# Patient Record
Sex: Female | Born: 1965 | Hispanic: No | Marital: Single
Health system: Southern US, Community
[De-identification: ages and names within clinical notes are randomized; demographics above are authoritative.]

---

## 2005-01-04 ENCOUNTER — Other Ambulatory Visit: Admission: RE | Admit: 2005-01-04 | Discharge: 2005-01-04 | Payer: Self-pay | Admitting: Family Medicine

## 2005-06-24 ENCOUNTER — Encounter (INDEPENDENT_AMBULATORY_CARE_PROVIDER_SITE_OTHER): Payer: Self-pay | Admitting: *Deleted

## 2005-06-24 ENCOUNTER — Inpatient Hospital Stay (HOSPITAL_COMMUNITY): Admission: RE | Admit: 2005-06-24 | Discharge: 2005-06-26 | Payer: Self-pay | Admitting: Obstetrics and Gynecology

## 2009-03-17 ENCOUNTER — Other Ambulatory Visit: Admission: RE | Admit: 2009-03-17 | Discharge: 2009-03-17 | Payer: Self-pay | Admitting: Family Medicine

## 2010-09-21 NOTE — Discharge Summary (Signed)
Summer Gentry, Summer Gentry              ACCOUNT NO.:  [id number]   MEDICAL RECORD NO.:  [id number]          PATIENT TYPE:  INP   LOCATION:  9302                          FACILITY:  [facility]   PHYSICIAN:  Gerald Leitz, MD          DATE OF BIRTH:  [birth year]   DATE OF ADMISSION:  06/24/2005  DATE OF DISCHARGE:  06/26/2005                                 DISCHARGE SUMMARY   HOSPITAL COURSE:  The patient was admitted on June 24, 2005. Underwent  an abdominal hysterectomy. She did well postoperatively. Postoperative  hematocrit was 7.4. This was treated with iron. She was discharged home on  June 26, 2005 on the following medications.   DISCHARGE MEDICATIONS:  Motrin, Percocet, iron sulfate. She was instructed  to take Colace over-the-counter.   DIET:  Regular.   ACTIVITY:  Ad lib. Pelvic rest for 4 weeks.   FOLLOW UP:  With Dr. Richardson Dopp in 2 weeks for postoperative visit.   CONDITION ON DISCHARGE:  No complications during admission.      Gerald Leitz, MD  Electronically Signed     TC/MEDQ  D:  08/10/2005  T:  08/10/2005  Job:  [id number]

## 2010-09-21 NOTE — H&P (Signed)
NAME:  Summer Gentry, Summer Gentry              ACCOUNT NO.:  [id number]   MEDICAL RECORD NO.:  [id number]          PATIENT TYPE:  INP   LOCATION:  NA                            [facility]  [facility]   PHYSICIAN:  Gerald Leitz, MD          DATE OF BIRTH:  1965/05/30   DATE OF ADMISSION:  DATE OF DISCHARGE:                                HISTORY & PHYSICAL   HISTORY OF PRESENT ILLNESS:  This is a 45 year old G3, P2-0-1-2 with  symptomatic uterine fibroids and menorrhagia.  She has an 18-week sized  fibroid uterus.  She has a history of having had intramenstrual bleeding for  the past three years.  Endometrial biopsy was performed in the office but  results are pending at the time of this dictation.  Patient reports severe  dysmenorrhea and extremely heavy menses.   PAST OBSTETRICAL HISTORY:  Cesarean section x2.   PAST MEDICAL HISTORY:  Negative.   PAST SURGICAL HISTORY:  Cesarean section x2.   MEDICATIONS:  Doxycycline for alopecia.   ALLERGIES:  ERYTHROMYCIN which causes nausea.   SOCIAL HISTORY:  Patient is married.  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  She denies tobacco, alcohol,  or illicit drug use.   FAMILY HISTORY:  Negative for breast, ovarian, or colon cancer.   REVIEW OF SYSTEMS:  Urinary frequency positive.  Other, review of systems is  negative.   PHYSICAL EXAMINATION:  VITAL SIGNS:  Blood pressure 136/92, heart rate 72,  weight 148.5 pounds, height 5 feet 3 inches.  CARDIOVASCULAR:  Regular rate and rhythm.  LUNGS:  Clear to auscultation bilaterally.  ABDOMEN:  Soft, nontender, nondistended.  Pelvic mass is noted on abdominal  examination that reaches at least 2 cm below the umbilicus.  PELVIC:  Normal external female genitalia.  No vulvar, vaginal, or cervical  lesions are noted.  On bimanual examination the uterus is approximately 18  weeks in size.  It is tender to palpation.  The adnexa are difficult to  evaluate secondary to pelvic mass.   The uterus is freely mobile and rectal  examination confirms pelvic examination.  EXTREMITIES:  No clubbing, cyanosis, edema.   LABORATORIES:  Pap smear performed September 2006 was normal.  Endometrial  biopsy results pending.   ASSESSMENT/PLAN:  Symptomatic uterine fibroids, menorrhagia, dysmenorrhea.  Patient desires definitive surgical management via abdominal hysterectomy.  We will leave the ovaries as long as they appear normal.  Risks, benefits,  and alternatives of surgery were discussed with the patient including  infection, bleeding, damage to  surrounding organs such as the bowel, bladder with need for further surgery.  Transfusion was discussed as well, HIV, hepatitis B, C, and syphilis.  Patient understands risks, wishes to proceed.  She is scheduled for surgery  on June 24, 2005.      Gerald Leitz, MD  Electronically Signed     TC/MEDQ  D:  06/19/2005  T:  06/19/2005  Job:  [id number]   cc:   [facility]   Admission testing

## 2010-09-21 NOTE — Op Note (Signed)
Summer Gentry, Summer Gentry              ACCOUNT NO.:  [id number]   MEDICAL RECORD NO.:  [id number]          PATIENT TYPE:  INP   LOCATION:  9302                          FACILITY:  [facility]   PHYSICIAN:  Gerald Leitz, MD          DATE OF BIRTH:  1966-02-24   DATE OF PROCEDURE:  06/24/2005  DATE OF DISCHARGE:                                 OPERATIVE REPORT   PREOPERATIVE DIAGNOSES:  1.  Symptomatic uterine fibroids.  2.  Menorrhagia.   POSTOPERATIVE DIAGNOSES:  1.  Symptomatic uterine fibroids.  2.  Menorrhagia.  3.  Abdominal and pelvic adhesions.   OPERATION/PROCEDURE:  Total abdominal hysterectomy with extensive lysis of  adhesions.   SURGEON:  1.  Gerald Leitz, M.D.  2.  Charles A. Sydnee Cabal, M.D.   ANESTHESIA:  General.   COMPLICATIONS:  None.   ESTIMATED BLOOD LOSS:  300 mL.   FLUIDS:  Per anesthesia.   URINARY OUTPUT:  Per anesthesia.   FINDINGS:  Enlarged fibroid uterus with multiple fibroids, approximately 18  weeks size.  Adhesions of the omentum to the anterior abdominal wall as well  as adhesions of the bladder to the uterus. Both ovaries appeared normal.   SPECIMENS:  Uterus.  All specimens sent to pathology.   COMPLICATIONS:  None.   DESCRIPTION OF PROCEDURE:  The risks, benefits, indications, and  alternatives to the procedure were reviewed with the patient.  Informed  consent was obtained.  The patient was taken to the operating room where she  was placed under general anesthesia.  She was then prepped and draped in the  usual sterile fashion.  A midline incision was made with the scalped, 2 cm  from the umbilicus down to the pubic symphysis.  The incision was carried  down to the underlying fascia.  The fascia was incised with the scalpel.  The incision was extended superiorly and inferiorly.  The peritoneum was  identified and entered sharply with Metzenbaum scissors.  The incision was  extended superiorly and inferiorly.  The patient was noted to have  adhesions  of omentum to the anterior abdominal wall.  These adhesions were removed  with the use of Kelly clamps, transected and then ligated with free-ties of  0 Vicryl.  Uterus was identified and noted to have adhesions to the anterior  abdominal wall.  This was taken down with blunt dissection and the bladder  was adherent high upon the uterus with extensive scar tissue.  This was  removed with a series of sharp dissection with Metzenbaum scissors.  Dissection took approximately 1-1/2 hours to restore normal anatomy.  Balfour retractor was then placed into the incision and the bowel was packed  away with moist laparotomy sponges.  Round ligaments were identified on both  sides, clamped and transected and suture ligated with 0 Vicryl.  The  anterior leaf of the broad ligament was incised along bladder reflection to  the midline on both sides.  Again extensive bladder adhesions were  encountered.  The bladder was gently dissected off the lower uterine segment  with Metzenbaum scissors.  Infundibulopelvic  ligaments were identified as  well as the ureters bilaterally.  The utero-ovarian ligaments were doubly  clamped, transected and suture ligated with 0 Vicryl. Excellent hemostasis  was noted.  The uterine arteries were then skeletonized bilaterally, clamped  with Heaney clamps, transected and suture ligated with 0 Vicryl.  Uterosacral ligaments on both sides were difficult to visualize due to the  size of the fibroid uterus once the uterine arteries were transected and  suture ligated.  The superior portion of the uterus was transected from the  cervix using scalpel to obtain better visualization.  The uterosacral  ligaments were then clamped on both sides, transected and suture ligated.  Cervix was amputated with Mayo scissors.  The vaginal cuff angles were  closed with 0 Vicryl and transfixed to the ipsilateral uterosacral ligament.  Next, the remainder of the vaginal cuff was closed  with running stitch of 0  Vicryl.  The pelvis was irrigated copiously with warm normal saline.  Complete hemostasis was assured.  The bladder was then filled with sterile  milk to assess for any bladder injury.  Approximately 250 mL were instilled.  No injuries were noted.  All laparotomy sponges and instruments were removed  from the abdomen.  Fascia was closed with 0 PDS in a running fashion.  The  skin was closed with  staples.  Sponge, lap, needle and instrument counts  were correct x2.  The patient was taken to the recovery room awake and in  stable condition.   Again, there were extensive abdominal and pelvic adhesions as well as  adhesions of the bladder to the uterus.  Length of time to perform lysis of  adhesions was approximately 1-1/2 hours, extending the length of the case.      Gerald Leitz, MD  Electronically Signed     TC/MEDQ  D:  06/24/2005  T:  06/25/2005  Job:  [id number]

## 2011-04-19 ENCOUNTER — Other Ambulatory Visit: Payer: Self-pay | Admitting: Family Medicine

## 2011-05-01 ENCOUNTER — Other Ambulatory Visit: Payer: Self-pay

## 2011-05-27 ENCOUNTER — Ambulatory Visit
Admission: RE | Admit: 2011-05-27 | Discharge: 2011-05-27 | Disposition: A | Discharge status: Home / Self Care | Payer: 59 | Source: Ambulatory Visit | Attending: Family Medicine | Admitting: Family Medicine

## 2011-12-24 ENCOUNTER — Other Ambulatory Visit: Payer: Self-pay | Admitting: Family Medicine

## 2011-12-24 ENCOUNTER — Other Ambulatory Visit (HOSPITAL_COMMUNITY)
Admission: RE | Admit: 2011-12-24 | Discharge: 2011-12-24 | Disposition: A | Discharge status: Home / Self Care | Payer: 59 | Source: Ambulatory Visit | Attending: Family Medicine | Admitting: Family Medicine

## 2011-12-24 DIAGNOSIS — Z113 Encounter for screening for infections with a predominantly sexual mode of transmission: Secondary | ICD-10-CM | POA: Insufficient documentation

## 2011-12-24 DIAGNOSIS — Z01419 Encounter for gynecological examination (general) (routine) without abnormal findings: Secondary | ICD-10-CM | POA: Insufficient documentation

## 2012-07-14 ENCOUNTER — Other Ambulatory Visit: Payer: Self-pay | Admitting: Obstetrics and Gynecology

## 2013-07-02 IMAGING — US US PELVIS COMPLETE
1 series · 14 of 25 positions shown · non-contrast
Comparison: None

CLINICAL DATA: Left lower abdominal and pelvic pain

TRANSABDOMINAL AND TRANSVAGINAL ULTRASOUND OF PELVIS
TECHNIQUE: Both transabdominal and transvaginal ultrasound
examinations of the pelvis were performed including evaluation of
the uterus, ovaries, adnexal regions, and pelvic cul-de-sac.

[Series 1: us pelvis complete · 0.28mm/px · 14 of 39 slices shown]
[im 1/39]
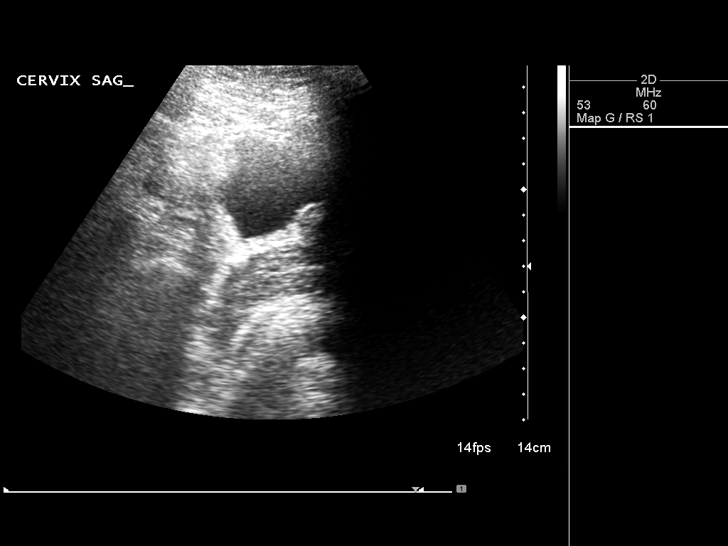
[im 4/39]
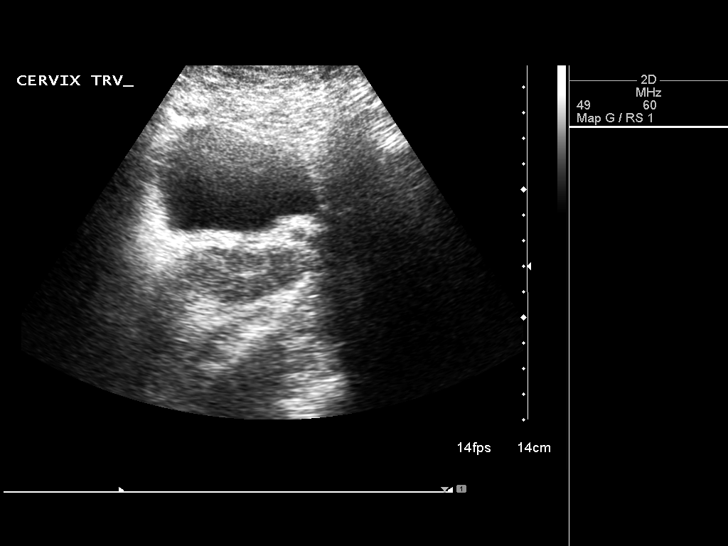
[im 7/39]
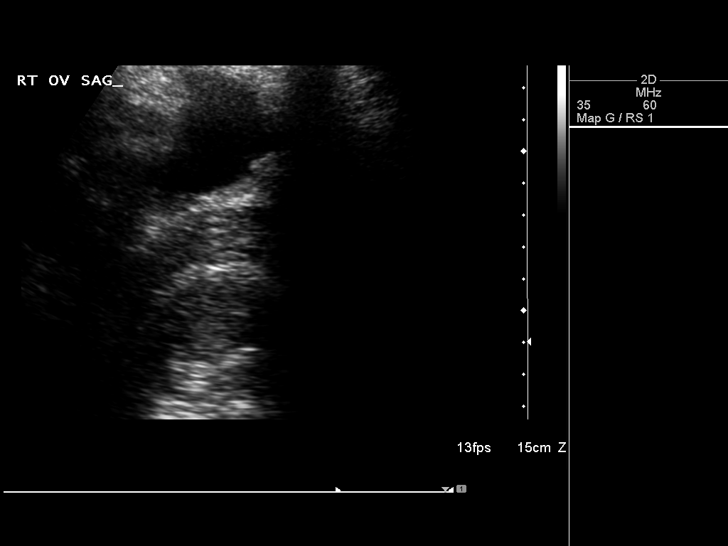
[im 10/39]
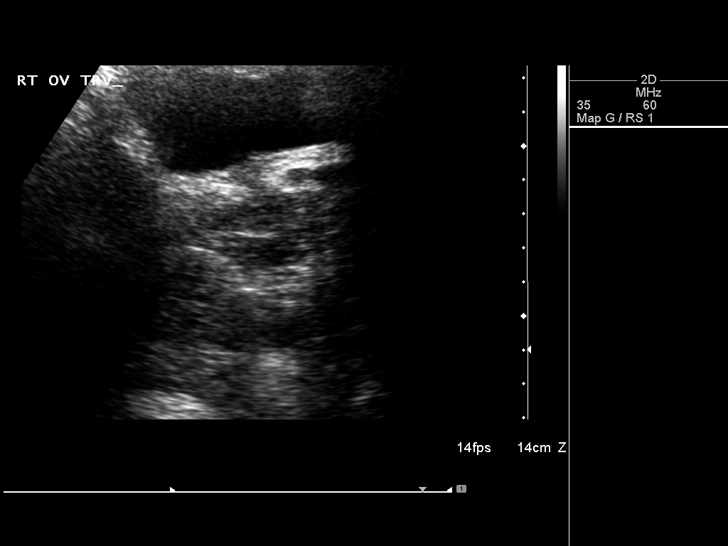
[im 13/39]
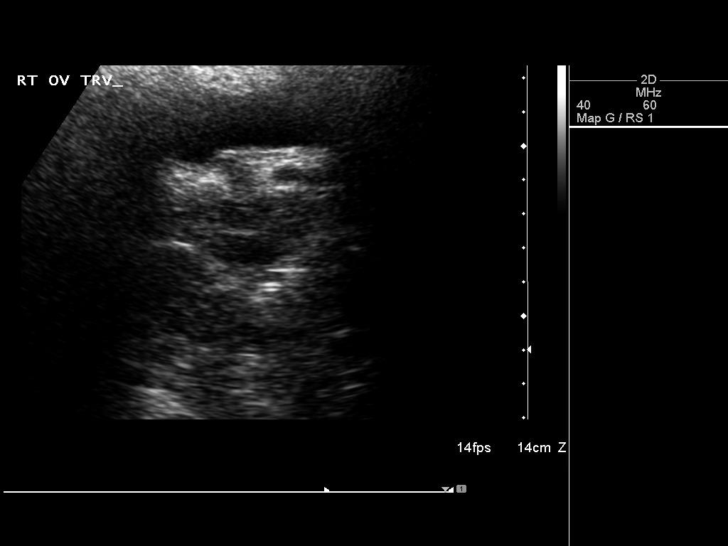
[im 15/39]
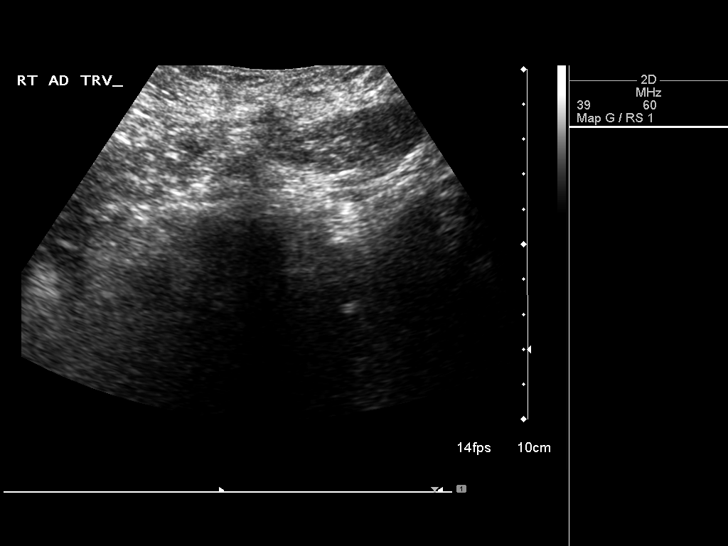
[im 18/39]
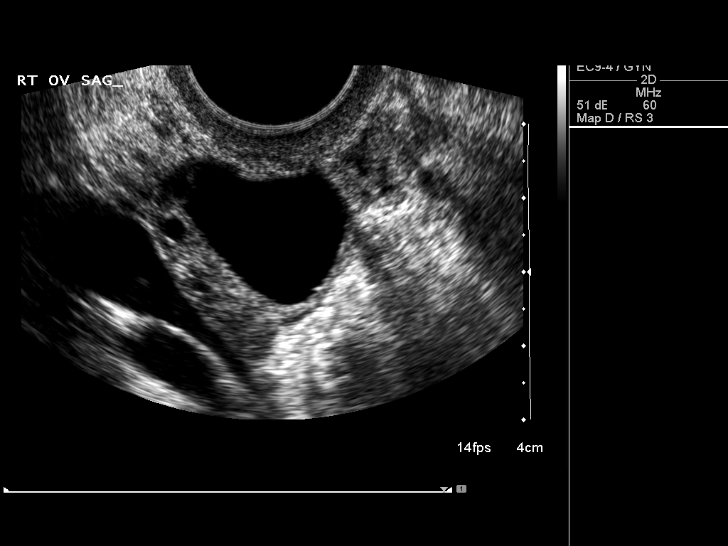
[im 21/39]
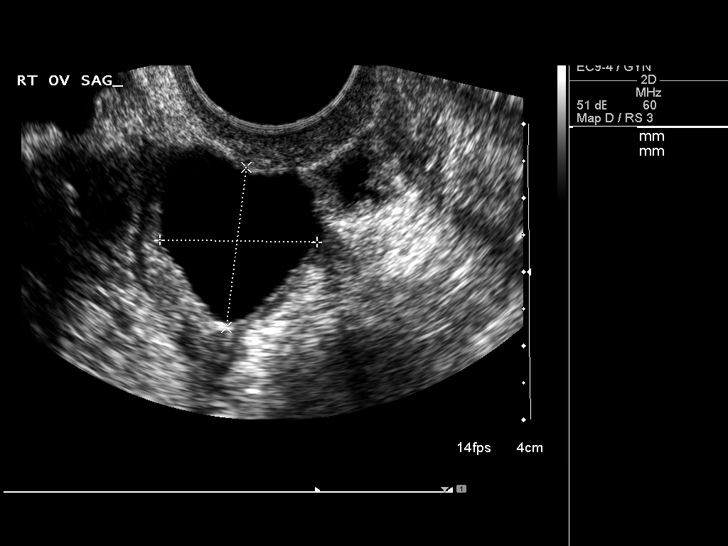
[im 24/39]
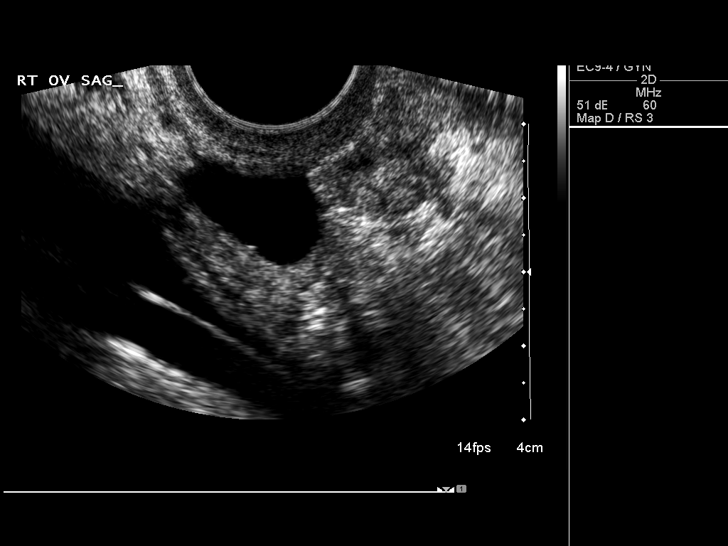
[im 26/39]
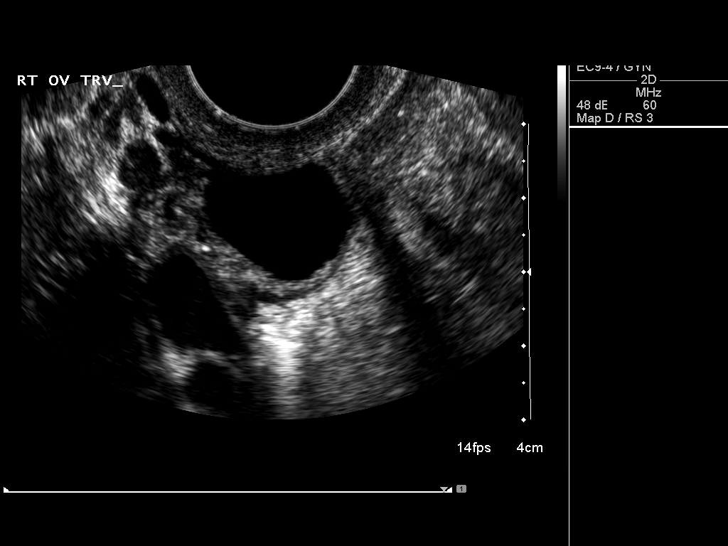
[im 29/39]
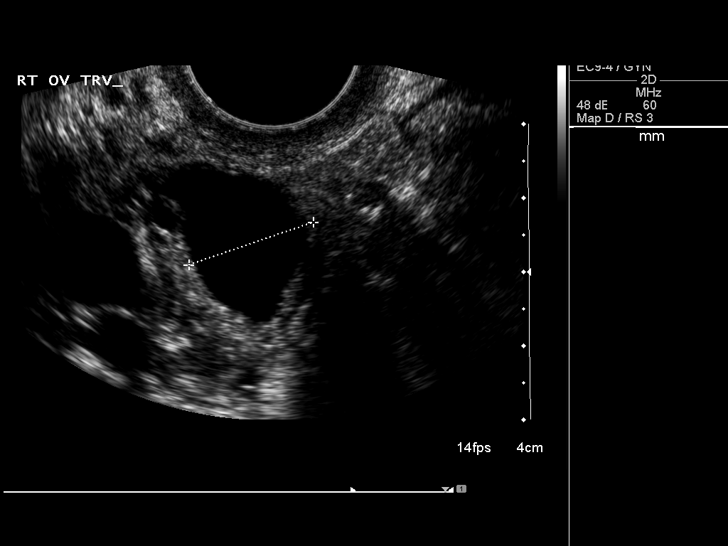
[im 32/39]
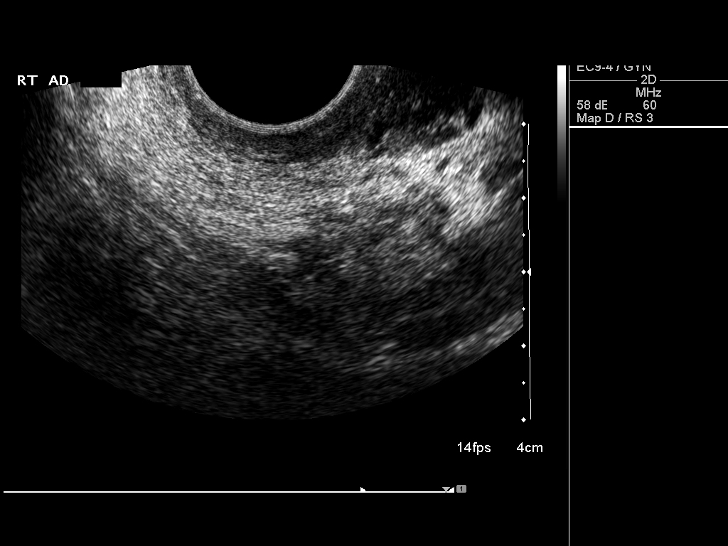
[im 35/39]
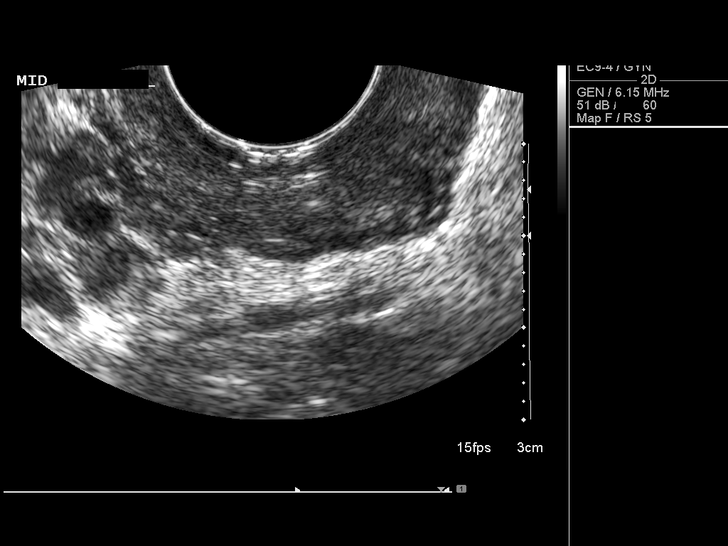
[im 39/39]
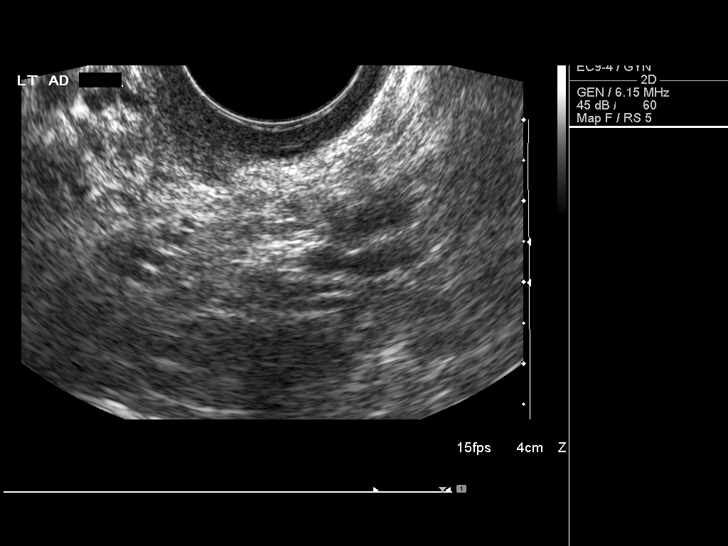

[14 of 25 positions shown; findings below may reference images not displayed]

FINDINGS: Uterus: The uterus has previously been resected.

Endometrium:Not applicable.

Right Ovary :The right ovary measures 2.8 x 2.2 but 2.5 cm.  A
right ovarian cyst is present of 2.1 x 2.2 x 1.8 cm with no
complicating features.

Left Ovary :The left ovary is not visualized, and there is a large
amount of bowel gas present.  No mass or fluid is seen within the
left adnexa.

Other Findings:
IMPRESSION: 2.2 cm right ovarian cyst.  The left ovary is not seen. Prior
hysterectomy.

## 2013-07-02 IMAGING — US US ABDOMEN COMPLETE
1 series · 14 of 25 positions shown · non-contrast
Comparison: None.

CLINICAL DATA: Abdominal pain.

COMPLETE ABDOMINAL ULTRASOUND

[Series 1: us abdomen complete · 0.17mm/px · 14 of 85 slices shown]
[im 1/85]
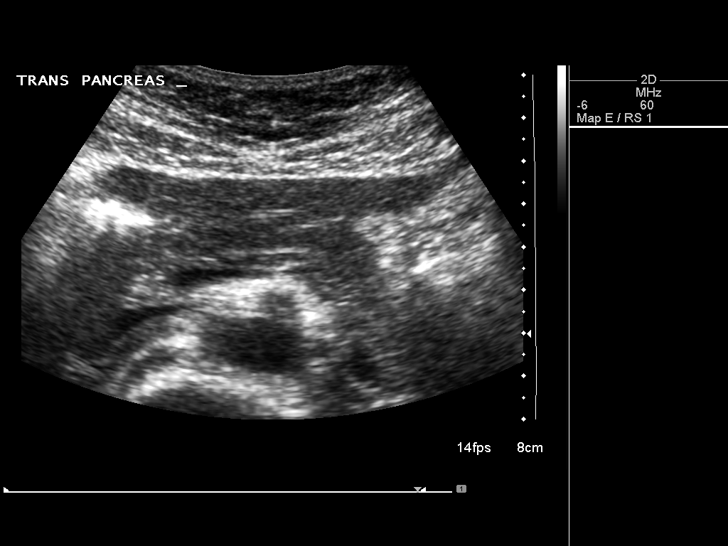
[im 8/85]
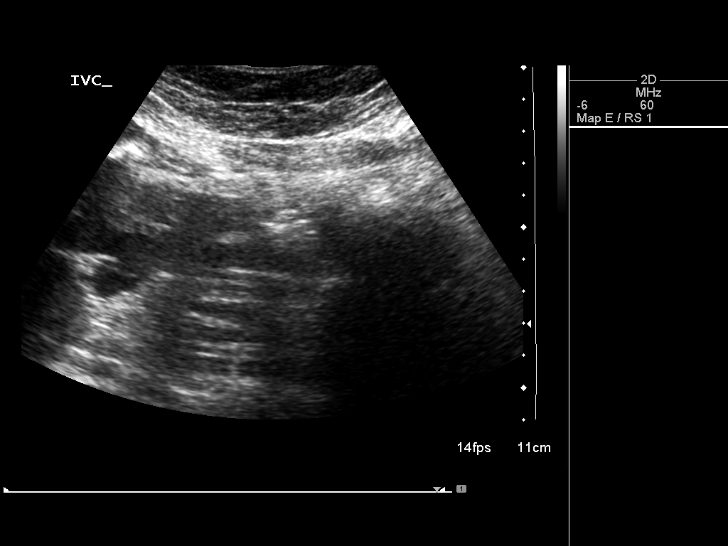
[im 15/85]
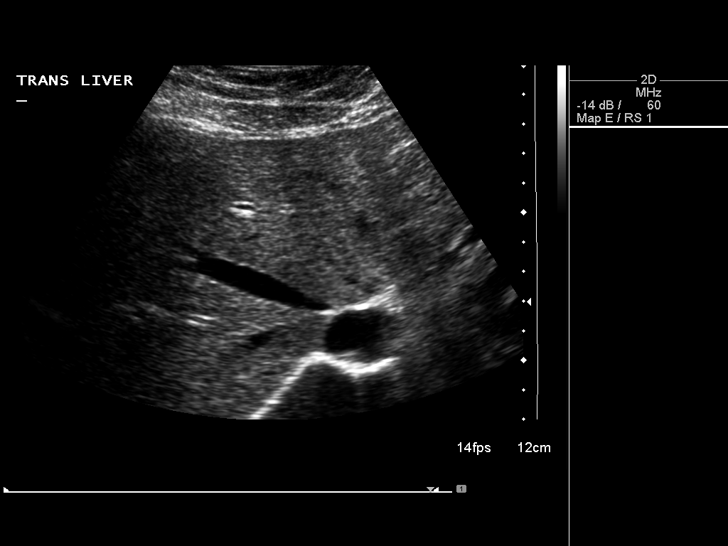
[im 22/85]
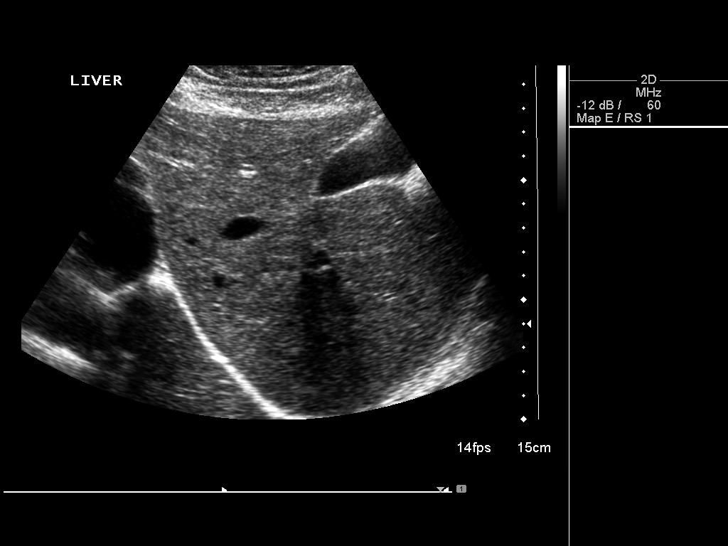
[im 29/85]
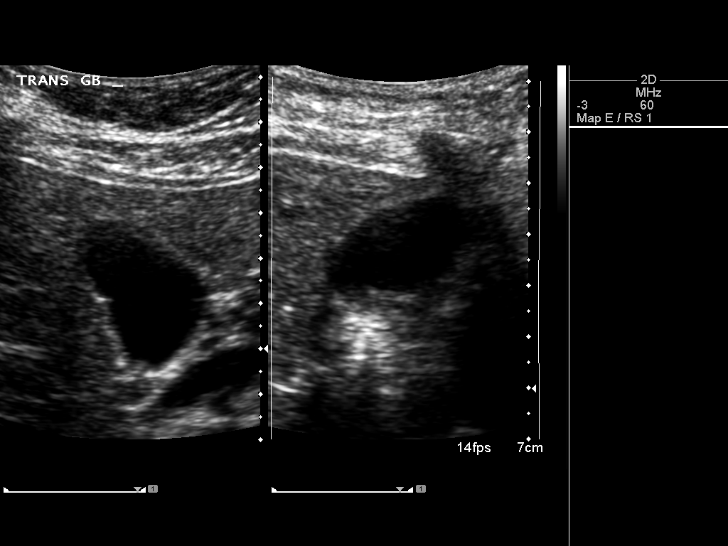
[im 32/85]
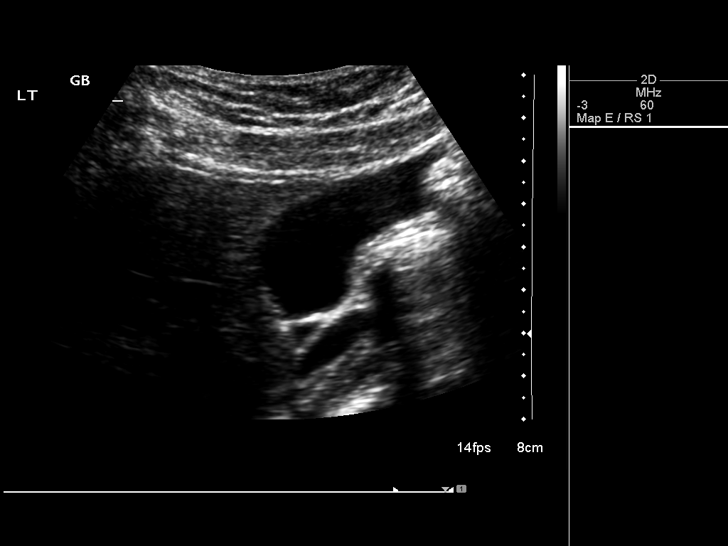
[im 39/85]
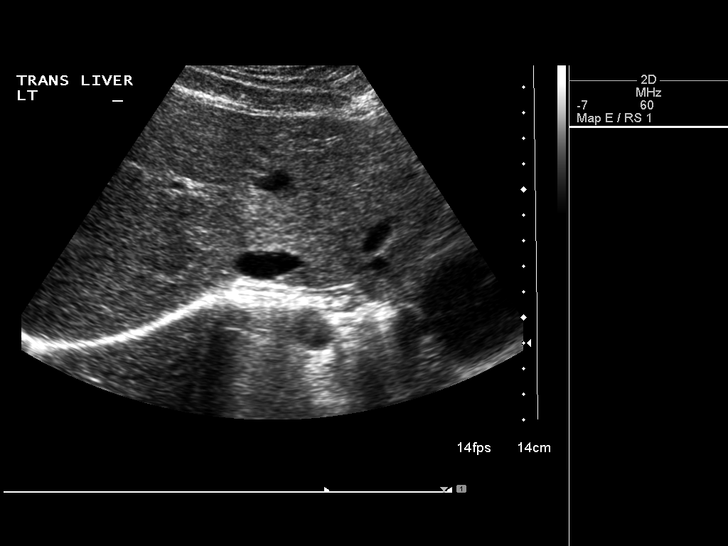
[im 46/85]
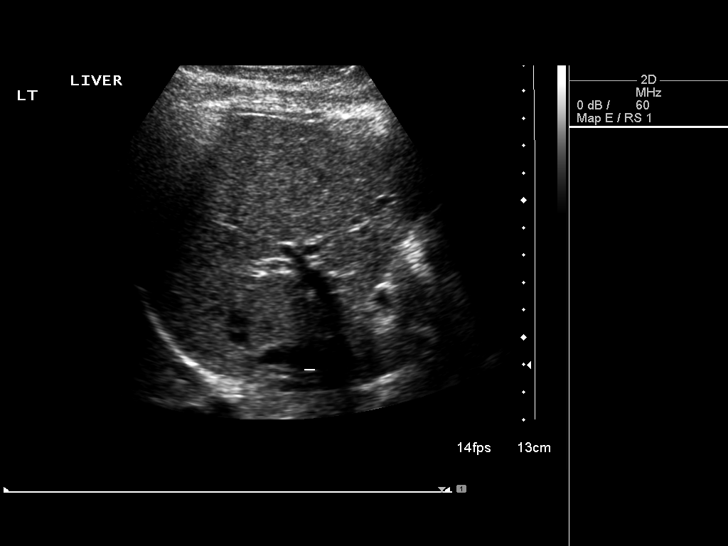
[im 53/85]
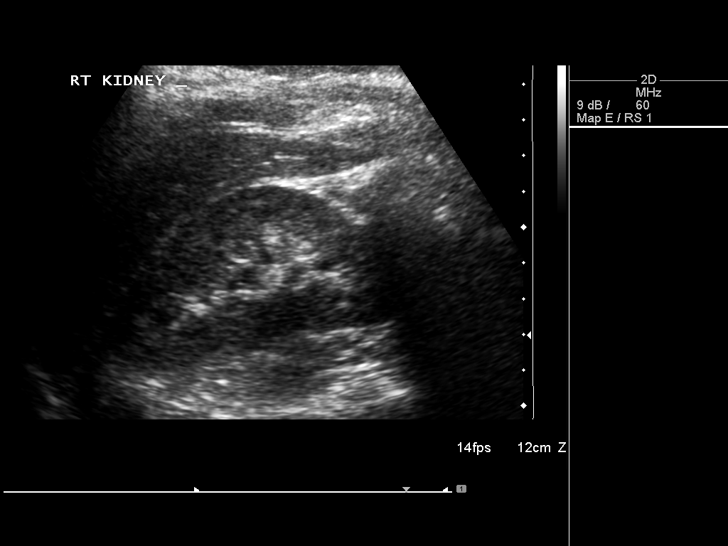
[im 57/85]
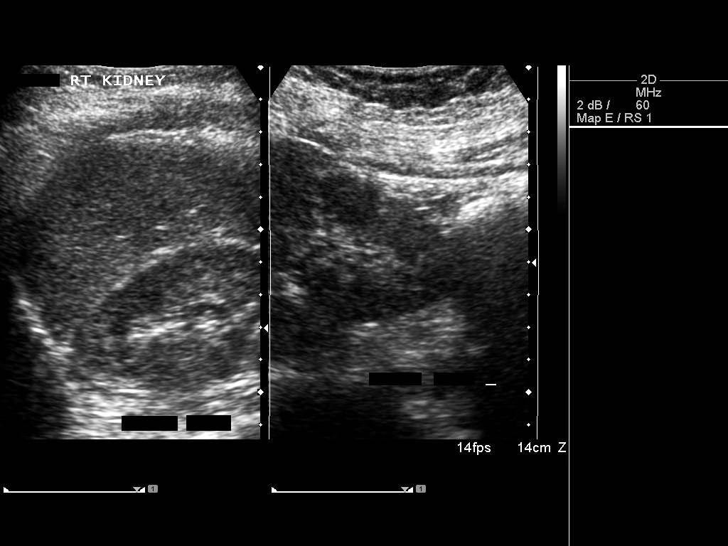
[im 64/85]
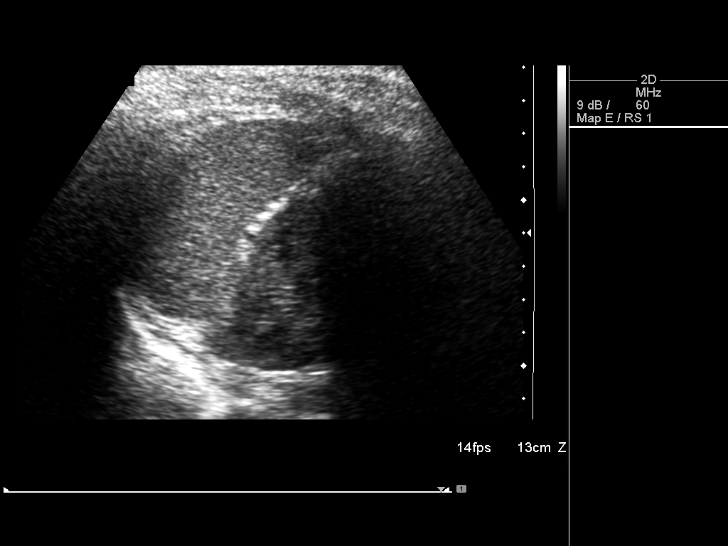
[im 71/85]
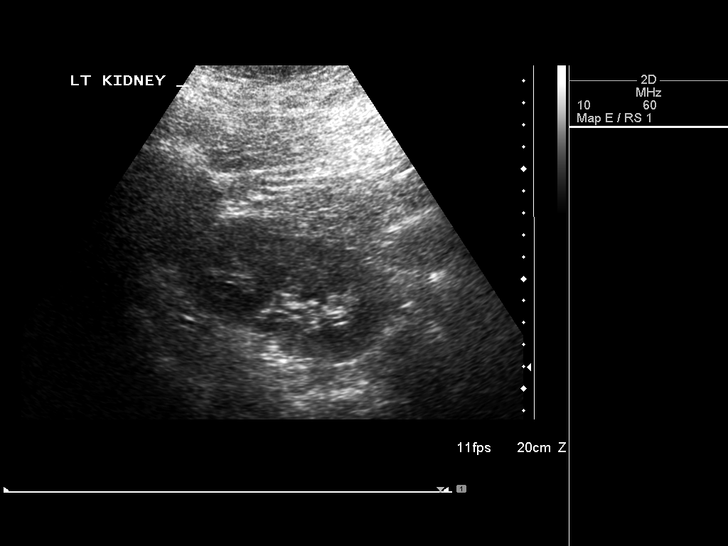
[im 78/85]
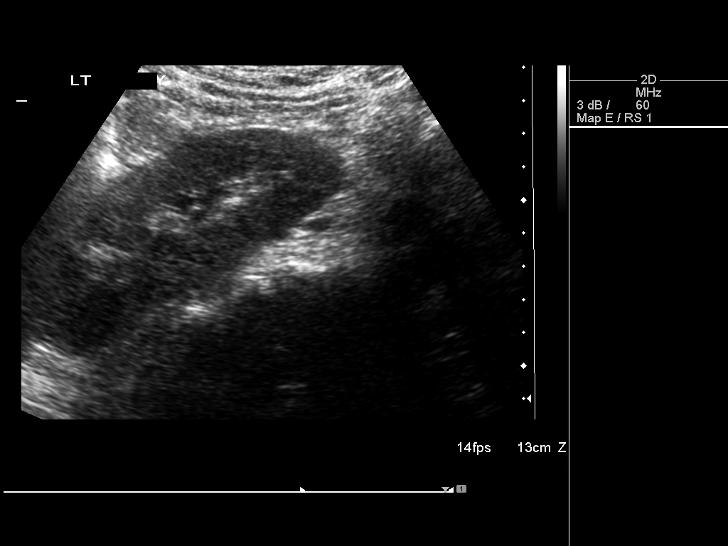
[im 85/85]
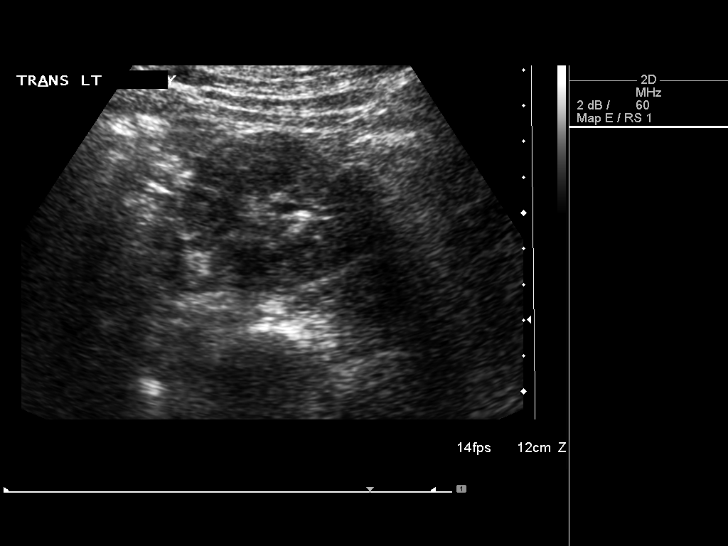

[14 of 25 positions shown; findings below may reference images not displayed]

FINDINGS: Gallbladder:  Appears unremarkable.  Sonographic Murphy's sign
absent.

Common bile duct:  Measures 3 mm in diameter, within normal limits.

Liver:  No focal lesion identified.  Within normal limits in
parenchymal echogenicity.

IVC:  Appears normal.

Pancreas:  Normal where visualized, although portions of the
pancreatic head and tail are not well seen due to overlying bowel
gas.

Spleen:  Measures 7 cm craniocaudad and appears normal.

Right Kidney:  10.8 cm in maximum length, with normal appearance.

Left Kidney:  10.6 cm in maximum length, with normal appearance.

Abdominal aorta:  No aneurysm identified.
IMPRESSION: 1.  No significant abnormality is observed account the patient's
abdominal pain.

## 2013-09-22 ENCOUNTER — Ambulatory Visit (HOSPITAL_COMMUNITY)
Admission: RE | Admit: 2013-09-22 | Discharge: 2013-09-22 | Disposition: A | Discharge status: Home / Self Care | Payer: 59 | Attending: Psychiatry | Admitting: Psychiatry

## 2013-09-22 NOTE — BH Assessment (Signed)
Assessment Note  Patient is a 48 year old African American female that reports increased feelings of anxiety.  Patient denies SI/HI/Psychosis.  Patient reports that he heart feels as if her heart is racing, tingling or numbness in her hands and fingers, feeling a loss of control and breathing difficulties. Patient reports that she has been feeling this way for the past couple 6 months.  Patient reports that she has been experiencing these symptoms every day but today she felt as if she was not able to handle the increase feeling of anxiety and nervousness.    Patient reports that she took medication in 2012 for depression after the death of her Uncle.  Patient reports that the medication was not effective even after the doctor increased the medication and then changed her to a different medication.  Patient reports that she does not remember the name of the medication.    Patient denies prior psychiatric hospitalizations.  Patient denies physical, sexual or emotional abuse.       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 Axis IV: occupational problems and problems related to social environment Axis V: 51-60 moderate symptoms  Past Medical History: No past medical history on file.  No past surgical history on file.  Family History: No family history on file.  Social History:  has no tobacco, alcohol, and drug history on file.  Additional Social History:     CIWA:   COWS:    Allergies: Allergies not on file  Home Medications:  (Not in a hospital admission)  OB/GYN Status:  No LMP recorded.  General Assessment Data Location of Assessment: [facility] Is this a Tele or Face-to-Face Assessment?: Face-to-Face Is this an Initial Assessment or a Re-assessment for this encounter?: Initial Assessment Living Arrangements: Children Can pt return to current living arrangement?: Yes Admission Status:  Voluntary Is patient capable of signing voluntary admission?: Yes Transfer from: Home Referral Source: Self/Family/Friend  Medical Screening Exam [facility] Walk-[facility] ONLY) Medical Exam completed: No Reason for MSE not completed: Patient Refused  [facility] Living Arrangements: Children Name of Psychiatrist: None Reported Name of Therapist: None Reported  Education Status Is patient currently in school?: No Current Grade: NA Highest grade of school patient has completed: NA Name of school: NA Contact person: NA  Risk to self Suicidal Ideation: No Suicidal Intent: No Is patient at risk for suicide?: No Suicidal Plan?: No Access to Means: No What has been your use of drugs/alcohol within the last 12 months?: None Reported Previous Attempts/Gestures: No How many times?: 0 Other Self Harm Risks: None Reported Triggers for Past Attempts:  (None Reported) Intentional Self Injurious Behavior: None Family Suicide History: No Recent stressful life event(s): Other (Comment) ( 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[employer] Persecutory voices/beliefs?: No Depression: Yes Depression Symptoms: Despondent;Tearfulness;Fatigue;Guilt;Loss of interest in usual pleasures Substance abuse history and/or treatment for substance abuse?: No Suicide prevention information given to non-admitted patients: Not applicable  Risk to Others Homicidal Ideation: No Thoughts of Harm to Others: No Current Homicidal Intent: No Current Homicidal Plan: No Access to Homicidal Means: No Identified Victim: None Reported History of harm to others?: No Assessment of Violence: None Noted Violent Behavior Description: Calm Does patient have access to weapons?: No Criminal Charges Pending?: No Does patient have a court date: No  Psychosis Hallucinations: None noted Delusions: None noted  Mental Status Report Appear/Hygiene: Disheveled Eye Contact: Fair Motor Activity: Freedom  of movement Speech: Logical/coherent Level  of Consciousness: Alert Mood: Anxious Affect: Anxious;Depressed Anxiety Level: Minimal Thought Processes: Coherent;Relevant Judgement: Unimpaired Orientation: Person;Place;Time;Situation Obsessive Compulsive Thoughts/Behaviors: None  Cognitive Functioning Concentration: Normal Memory: Recent Intact;Remote Intact IQ: Average Insight: Good Impulse Control: Fair Appetite: Fair Weight Loss: 0 Weight Gain: 0 Sleep: Decreased Total Hours of Sleep: 5 Vegetative Symptoms: None  ADLScreening [facility]) Patient's cognitive ability adequate to safely complete daily activities?: Yes Patient able to express need for assistance with ADLs?: Yes Independently performs ADLs?: Yes (appropriate for developmental age)  Prior Inpatient Therapy Prior Inpatient Therapy: No Prior Therapy Dates: NA Prior Therapy Facilty/Provider(s): NA Reason for Treatment: NA  Prior Outpatient Therapy Prior Outpatient Therapy: Yes Prior Therapy Dates: 2012 Prior Therapy Facilty/Provider(s): Unable to remember  (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) Reason for Treatment: Anxiety  ADL Screening (condition at time of admission) Patient's cognitive ability adequate to safely complete daily activities?: Yes Patient able to express need for assistance with ADLs?: Yes Independently performs ADLs?: Yes (appropriate for developmental age)                  Additional Information 1:1 In Past 12 Months?: No CIRT Risk: No Elopement Risk: No Does patient have medical clearance?: Yes     Disposition: Per Renata Capriceonrad, NP the patient is psychiatrically stable and will be discharged with outpatient referrals.  Disposition Initial Assessment Completed for this Encounter: Yes Disposition of Patient: Other dispositions Other disposition(s): Other (Comment)  On Site Evaluation by:   Reviewed with Physician:    Clyde CanterburyAva L Haruka Kowaleski 09/22/2013 1:39 PM

## 2015-11-09 DIAGNOSIS — R928 Other abnormal and inconclusive findings on diagnostic imaging of breast: Secondary | ICD-10-CM | POA: Diagnosis not present

## 2015-11-09 DIAGNOSIS — N644 Mastodynia: Secondary | ICD-10-CM | POA: Diagnosis not present

## 2016-01-01 DIAGNOSIS — R202 Paresthesia of skin: Secondary | ICD-10-CM | POA: Diagnosis not present

## 2016-01-01 DIAGNOSIS — L84 Corns and callosities: Secondary | ICD-10-CM | POA: Diagnosis not present

## 2016-01-01 DIAGNOSIS — N951 Menopausal and female climacteric states: Secondary | ICD-10-CM | POA: Diagnosis not present

## 2016-02-05 DIAGNOSIS — E0590 Thyrotoxicosis, unspecified without thyrotoxic crisis or storm: Secondary | ICD-10-CM | POA: Diagnosis not present

## 2016-03-07 ENCOUNTER — Encounter: Payer: Self-pay | Admitting: Internal Medicine

## 2017-10-20 DIAGNOSIS — R030 Elevated blood-pressure reading, without diagnosis of hypertension: Secondary | ICD-10-CM | POA: Diagnosis not present

## 2017-10-20 DIAGNOSIS — A6004 Herpesviral vulvovaginitis: Secondary | ICD-10-CM | POA: Diagnosis not present

## 2017-10-27 DIAGNOSIS — E669 Obesity, unspecified: Secondary | ICD-10-CM | POA: Diagnosis not present

## 2017-10-27 DIAGNOSIS — I10 Essential (primary) hypertension: Secondary | ICD-10-CM | POA: Diagnosis not present

## 2017-10-27 DIAGNOSIS — R739 Hyperglycemia, unspecified: Secondary | ICD-10-CM | POA: Diagnosis not present

## 2017-11-24 DIAGNOSIS — E119 Type 2 diabetes mellitus without complications: Secondary | ICD-10-CM | POA: Diagnosis not present

## 2017-11-24 DIAGNOSIS — N951 Menopausal and female climacteric states: Secondary | ICD-10-CM | POA: Diagnosis not present

## 2017-11-24 DIAGNOSIS — Z01411 Encounter for gynecological examination (general) (routine) with abnormal findings: Secondary | ICD-10-CM | POA: Diagnosis not present

## 2017-11-28 DIAGNOSIS — Z008 Encounter for other general examination: Secondary | ICD-10-CM | POA: Diagnosis not present

## 2018-01-14 DIAGNOSIS — R51 Headache: Secondary | ICD-10-CM | POA: Diagnosis not present

## 2018-01-14 DIAGNOSIS — F0781 Postconcussional syndrome: Secondary | ICD-10-CM | POA: Diagnosis not present

## 2018-01-17 DIAGNOSIS — G44309 Post-traumatic headache, unspecified, not intractable: Secondary | ICD-10-CM | POA: Diagnosis not present

## 2018-01-17 DIAGNOSIS — S0990XA Unspecified injury of head, initial encounter: Secondary | ICD-10-CM | POA: Diagnosis not present

## 2018-01-17 DIAGNOSIS — R5383 Other fatigue: Secondary | ICD-10-CM | POA: Diagnosis not present

## 2018-01-17 DIAGNOSIS — R42 Dizziness and giddiness: Secondary | ICD-10-CM | POA: Diagnosis not present

## 2018-01-17 DIAGNOSIS — F0781 Postconcussional syndrome: Secondary | ICD-10-CM | POA: Diagnosis not present

## 2018-01-17 DIAGNOSIS — M542 Cervicalgia: Secondary | ICD-10-CM | POA: Diagnosis not present

## 2018-01-17 DIAGNOSIS — S0990XD Unspecified injury of head, subsequent encounter: Secondary | ICD-10-CM | POA: Diagnosis not present

## 2018-02-04 DIAGNOSIS — R42 Dizziness and giddiness: Secondary | ICD-10-CM | POA: Diagnosis not present

## 2018-02-04 DIAGNOSIS — R5383 Other fatigue: Secondary | ICD-10-CM | POA: Diagnosis not present

## 2018-02-04 DIAGNOSIS — R51 Headache: Secondary | ICD-10-CM | POA: Diagnosis not present

## 2018-02-26 DIAGNOSIS — H524 Presbyopia: Secondary | ICD-10-CM | POA: Diagnosis not present

## 2018-02-27 DIAGNOSIS — I10 Essential (primary) hypertension: Secondary | ICD-10-CM | POA: Diagnosis not present

## 2018-02-27 DIAGNOSIS — Z6834 Body mass index (BMI) 34.0-34.9, adult: Secondary | ICD-10-CM | POA: Diagnosis not present

## 2018-02-27 DIAGNOSIS — E119 Type 2 diabetes mellitus without complications: Secondary | ICD-10-CM | POA: Diagnosis not present

## 2018-02-27 DIAGNOSIS — E669 Obesity, unspecified: Secondary | ICD-10-CM | POA: Diagnosis not present

## 2018-05-20 DIAGNOSIS — Z008 Encounter for other general examination: Secondary | ICD-10-CM | POA: Diagnosis not present

## 2018-05-20 DIAGNOSIS — E119 Type 2 diabetes mellitus without complications: Secondary | ICD-10-CM | POA: Diagnosis not present

## 2018-05-20 DIAGNOSIS — I10 Essential (primary) hypertension: Secondary | ICD-10-CM | POA: Diagnosis not present

## 2018-07-01 DIAGNOSIS — R51 Headache: Secondary | ICD-10-CM | POA: Diagnosis not present

## 2018-07-01 DIAGNOSIS — R4182 Altered mental status, unspecified: Secondary | ICD-10-CM | POA: Diagnosis not present

## 2018-07-01 DIAGNOSIS — W01198A Fall on same level from slipping, tripping and stumbling with subsequent striking against other object, initial encounter: Secondary | ICD-10-CM | POA: Diagnosis not present

## 2018-07-01 DIAGNOSIS — Z79899 Other long term (current) drug therapy: Secondary | ICD-10-CM | POA: Diagnosis not present

## 2018-07-01 DIAGNOSIS — S0990XA Unspecified injury of head, initial encounter: Secondary | ICD-10-CM | POA: Diagnosis not present

## 2018-07-01 DIAGNOSIS — Z7984 Long term (current) use of oral hypoglycemic drugs: Secondary | ICD-10-CM | POA: Diagnosis not present

## 2018-07-01 DIAGNOSIS — R42 Dizziness and giddiness: Secondary | ICD-10-CM | POA: Diagnosis not present

## 2018-07-01 DIAGNOSIS — Z881 Allergy status to other antibiotic agents status: Secondary | ICD-10-CM | POA: Diagnosis not present

## 2018-07-08 DIAGNOSIS — R42 Dizziness and giddiness: Secondary | ICD-10-CM | POA: Diagnosis not present

## 2019-06-05 DIAGNOSIS — Z7984 Long term (current) use of oral hypoglycemic drugs: Secondary | ICD-10-CM | POA: Diagnosis not present

## 2019-06-05 DIAGNOSIS — E119 Type 2 diabetes mellitus without complications: Secondary | ICD-10-CM | POA: Diagnosis not present

## 2019-06-05 DIAGNOSIS — I10 Essential (primary) hypertension: Secondary | ICD-10-CM | POA: Diagnosis not present

## 2019-06-05 DIAGNOSIS — B009 Herpesviral infection, unspecified: Secondary | ICD-10-CM | POA: Diagnosis not present

## 2019-07-26 DIAGNOSIS — R351 Nocturia: Secondary | ICD-10-CM | POA: Diagnosis not present

## 2019-07-26 DIAGNOSIS — E1159 Type 2 diabetes mellitus with other circulatory complications: Secondary | ICD-10-CM | POA: Diagnosis not present

## 2019-07-26 DIAGNOSIS — Z9189 Other specified personal risk factors, not elsewhere classified: Secondary | ICD-10-CM | POA: Diagnosis not present

## 2019-07-26 DIAGNOSIS — I10 Essential (primary) hypertension: Secondary | ICD-10-CM | POA: Diagnosis not present

## 2019-07-26 DIAGNOSIS — E119 Type 2 diabetes mellitus without complications: Secondary | ICD-10-CM | POA: Diagnosis not present

## 2019-09-02 DIAGNOSIS — I152 Hypertension secondary to endocrine disorders: Secondary | ICD-10-CM | POA: Diagnosis not present

## 2019-09-02 DIAGNOSIS — R0683 Snoring: Secondary | ICD-10-CM | POA: Diagnosis not present

## 2019-09-02 DIAGNOSIS — Z6836 Body mass index (BMI) 36.0-36.9, adult: Secondary | ICD-10-CM | POA: Diagnosis not present

## 2019-09-02 DIAGNOSIS — E1159 Type 2 diabetes mellitus with other circulatory complications: Secondary | ICD-10-CM | POA: Diagnosis not present

## 2019-09-06 DIAGNOSIS — F0781 Postconcussional syndrome: Secondary | ICD-10-CM | POA: Diagnosis not present

## 2019-09-06 DIAGNOSIS — G44209 Tension-type headache, unspecified, not intractable: Secondary | ICD-10-CM | POA: Diagnosis not present

## 2019-09-06 DIAGNOSIS — I10 Essential (primary) hypertension: Secondary | ICD-10-CM | POA: Diagnosis not present

## 2019-09-06 DIAGNOSIS — R42 Dizziness and giddiness: Secondary | ICD-10-CM | POA: Diagnosis not present

## 2019-09-26 DIAGNOSIS — I10 Essential (primary) hypertension: Secondary | ICD-10-CM | POA: Diagnosis not present

## 2019-09-26 DIAGNOSIS — Z7982 Long term (current) use of aspirin: Secondary | ICD-10-CM | POA: Diagnosis not present

## 2019-09-26 DIAGNOSIS — R531 Weakness: Secondary | ICD-10-CM | POA: Diagnosis not present

## 2019-09-26 DIAGNOSIS — J9601 Acute respiratory failure with hypoxia: Secondary | ICD-10-CM | POA: Diagnosis not present

## 2019-09-26 DIAGNOSIS — R0902 Hypoxemia: Secondary | ICD-10-CM | POA: Diagnosis not present

## 2019-09-26 DIAGNOSIS — Z79899 Other long term (current) drug therapy: Secondary | ICD-10-CM | POA: Diagnosis not present

## 2019-09-26 DIAGNOSIS — Z6835 Body mass index (BMI) 35.0-35.9, adult: Secondary | ICD-10-CM | POA: Diagnosis not present

## 2019-09-26 DIAGNOSIS — E119 Type 2 diabetes mellitus without complications: Secondary | ICD-10-CM | POA: Diagnosis not present

## 2019-09-26 DIAGNOSIS — J1282 Pneumonia due to coronavirus disease 2019: Secondary | ICD-10-CM | POA: Diagnosis not present

## 2019-09-26 DIAGNOSIS — Z881 Allergy status to other antibiotic agents status: Secondary | ICD-10-CM | POA: Diagnosis not present

## 2019-09-26 DIAGNOSIS — E1169 Type 2 diabetes mellitus with other specified complication: Secondary | ICD-10-CM | POA: Diagnosis not present

## 2019-09-26 DIAGNOSIS — U071 COVID-19: Secondary | ICD-10-CM | POA: Diagnosis not present

## 2019-09-26 DIAGNOSIS — R05 Cough: Secondary | ICD-10-CM | POA: Diagnosis not present

## 2019-09-26 DIAGNOSIS — Z7984 Long term (current) use of oral hypoglycemic drugs: Secondary | ICD-10-CM | POA: Diagnosis not present

## 2019-09-26 DIAGNOSIS — R0602 Shortness of breath: Secondary | ICD-10-CM | POA: Diagnosis not present

## 2019-09-27 DIAGNOSIS — I10 Essential (primary) hypertension: Secondary | ICD-10-CM | POA: Diagnosis not present

## 2019-09-27 DIAGNOSIS — J9601 Acute respiratory failure with hypoxia: Secondary | ICD-10-CM | POA: Diagnosis not present

## 2019-09-27 DIAGNOSIS — U071 COVID-19: Secondary | ICD-10-CM | POA: Diagnosis not present

## 2019-09-28 DIAGNOSIS — E1169 Type 2 diabetes mellitus with other specified complication: Secondary | ICD-10-CM | POA: Diagnosis not present

## 2019-09-28 DIAGNOSIS — U071 COVID-19: Secondary | ICD-10-CM | POA: Diagnosis not present

## 2019-09-28 DIAGNOSIS — I10 Essential (primary) hypertension: Secondary | ICD-10-CM | POA: Diagnosis not present

## 2024-08-10 ENCOUNTER — Ambulatory Visit: Admitting: Dermatology
